# Patient Record
Sex: Female | Born: 1975 | Race: White | Hispanic: No | Marital: Single | Smoking: Current every day smoker
Health system: Southern US, Community
[De-identification: ages and names within clinical notes are randomized; demographics above are authoritative.]

## PROBLEM LIST (undated history)

## (undated) DIAGNOSIS — F329 Major depressive disorder, single episode, unspecified: Secondary | ICD-10-CM

## (undated) DIAGNOSIS — F32A Depression, unspecified: Secondary | ICD-10-CM

## (undated) DIAGNOSIS — G4700 Insomnia, unspecified: Secondary | ICD-10-CM

## (undated) HISTORY — PX: BREAST LUMPECTOMY: SHX2

---

## 2001-09-16 ENCOUNTER — Other Ambulatory Visit: Admission: RE | Admit: 2001-09-16 | Discharge: 2001-09-16 | Payer: Self-pay | Admitting: Gynecology

## 2002-12-14 ENCOUNTER — Other Ambulatory Visit: Admission: RE | Admit: 2002-12-14 | Discharge: 2002-12-14 | Payer: Self-pay | Admitting: Gynecology

## 2003-11-01 ENCOUNTER — Other Ambulatory Visit: Admission: RE | Admit: 2003-11-01 | Discharge: 2003-11-01 | Payer: Self-pay | Admitting: Gynecology

## 2003-12-27 ENCOUNTER — Encounter (INDEPENDENT_AMBULATORY_CARE_PROVIDER_SITE_OTHER): Payer: Self-pay | Admitting: Specialist

## 2003-12-27 ENCOUNTER — Ambulatory Visit (HOSPITAL_BASED_OUTPATIENT_CLINIC_OR_DEPARTMENT_OTHER): Admission: RE | Admit: 2003-12-27 | Discharge: 2003-12-27 | Payer: Self-pay | Admitting: Surgery

## 2003-12-27 ENCOUNTER — Ambulatory Visit (HOSPITAL_COMMUNITY): Admission: RE | Admit: 2003-12-27 | Discharge: 2003-12-27 | Payer: Self-pay | Admitting: Surgery

## 2004-10-30 ENCOUNTER — Other Ambulatory Visit: Admission: RE | Admit: 2004-10-30 | Discharge: 2004-10-30 | Payer: Self-pay | Admitting: Gynecology

## 2004-11-15 ENCOUNTER — Encounter: Admission: RE | Admit: 2004-11-15 | Discharge: 2004-11-15 | Payer: Self-pay | Admitting: Surgery

## 2004-11-18 ENCOUNTER — Ambulatory Visit (HOSPITAL_COMMUNITY): Admission: RE | Admit: 2004-11-18 | Discharge: 2004-11-18 | Payer: Self-pay | Admitting: Surgery

## 2004-12-19 ENCOUNTER — Ambulatory Visit (HOSPITAL_COMMUNITY): Admission: RE | Admit: 2004-12-19 | Discharge: 2004-12-19 | Payer: Self-pay | Admitting: Surgery

## 2004-12-19 ENCOUNTER — Encounter: Admission: RE | Admit: 2004-12-19 | Discharge: 2004-12-19 | Payer: Self-pay | Admitting: Surgery

## 2004-12-19 ENCOUNTER — Encounter (INDEPENDENT_AMBULATORY_CARE_PROVIDER_SITE_OTHER): Payer: Self-pay | Admitting: *Deleted

## 2004-12-19 ENCOUNTER — Ambulatory Visit (HOSPITAL_BASED_OUTPATIENT_CLINIC_OR_DEPARTMENT_OTHER): Admission: RE | Admit: 2004-12-19 | Discharge: 2004-12-19 | Payer: Self-pay | Admitting: Surgery

## 2005-11-06 ENCOUNTER — Other Ambulatory Visit: Admission: RE | Admit: 2005-11-06 | Discharge: 2005-11-06 | Payer: Self-pay | Admitting: Gynecology

## 2006-04-02 ENCOUNTER — Ambulatory Visit: Payer: Self-pay | Admitting: Internal Medicine

## 2006-11-02 ENCOUNTER — Other Ambulatory Visit: Admission: RE | Admit: 2006-11-02 | Discharge: 2006-11-02 | Payer: Self-pay | Admitting: Gynecology

## 2006-12-03 ENCOUNTER — Encounter: Admission: RE | Admit: 2006-12-03 | Discharge: 2006-12-03 | Payer: Self-pay | Admitting: Surgery

## 2007-05-03 ENCOUNTER — Ambulatory Visit: Payer: Self-pay | Admitting: Internal Medicine

## 2007-05-03 DIAGNOSIS — F329 Major depressive disorder, single episode, unspecified: Secondary | ICD-10-CM

## 2007-05-03 DIAGNOSIS — F3289 Other specified depressive episodes: Secondary | ICD-10-CM | POA: Insufficient documentation

## 2007-05-03 DIAGNOSIS — F519 Sleep disorder not due to a substance or known physiological condition, unspecified: Secondary | ICD-10-CM | POA: Insufficient documentation

## 2007-05-03 DIAGNOSIS — F411 Generalized anxiety disorder: Secondary | ICD-10-CM | POA: Insufficient documentation

## 2007-05-25 ENCOUNTER — Telehealth (INDEPENDENT_AMBULATORY_CARE_PROVIDER_SITE_OTHER): Payer: Self-pay | Admitting: *Deleted

## 2007-11-01 LAB — CONVERTED CEMR LAB: Pap Smear: NORMAL

## 2008-05-18 ENCOUNTER — Telehealth (INDEPENDENT_AMBULATORY_CARE_PROVIDER_SITE_OTHER): Payer: Self-pay | Admitting: *Deleted

## 2008-05-22 ENCOUNTER — Ambulatory Visit: Payer: Self-pay | Admitting: Internal Medicine

## 2008-05-22 DIAGNOSIS — R1084 Generalized abdominal pain: Secondary | ICD-10-CM | POA: Insufficient documentation

## 2008-05-22 DIAGNOSIS — K219 Gastro-esophageal reflux disease without esophagitis: Secondary | ICD-10-CM | POA: Insufficient documentation

## 2008-05-30 ENCOUNTER — Encounter: Admission: RE | Admit: 2008-05-30 | Discharge: 2008-05-30 | Payer: Self-pay | Admitting: Internal Medicine

## 2008-06-19 ENCOUNTER — Telehealth (INDEPENDENT_AMBULATORY_CARE_PROVIDER_SITE_OTHER): Payer: Self-pay | Admitting: *Deleted

## 2008-06-26 ENCOUNTER — Telehealth (INDEPENDENT_AMBULATORY_CARE_PROVIDER_SITE_OTHER): Payer: Self-pay | Admitting: *Deleted

## 2008-07-21 ENCOUNTER — Telehealth (INDEPENDENT_AMBULATORY_CARE_PROVIDER_SITE_OTHER): Payer: Self-pay | Admitting: *Deleted

## 2008-08-17 ENCOUNTER — Telehealth (INDEPENDENT_AMBULATORY_CARE_PROVIDER_SITE_OTHER): Payer: Self-pay | Admitting: *Deleted

## 2008-12-14 ENCOUNTER — Telehealth: Payer: Self-pay | Admitting: Internal Medicine

## 2009-01-15 ENCOUNTER — Telehealth: Payer: Self-pay | Admitting: Internal Medicine

## 2009-03-16 ENCOUNTER — Telehealth: Payer: Self-pay | Admitting: Internal Medicine

## 2010-03-06 ENCOUNTER — Emergency Department (HOSPITAL_BASED_OUTPATIENT_CLINIC_OR_DEPARTMENT_OTHER)
Admission: EM | Admit: 2010-03-06 | Discharge: 2010-03-06 | Disposition: A | Discharge status: Still In Hospital | Payer: Self-pay | Source: Home / Self Care | Admitting: Emergency Medicine

## 2010-03-07 ENCOUNTER — Inpatient Hospital Stay (HOSPITAL_COMMUNITY)
Admission: EM | Admit: 2010-03-07 | Discharge: 2010-03-10 | Payer: Self-pay | Attending: Internal Medicine | Admitting: Internal Medicine

## 2010-03-08 DIAGNOSIS — F411 Generalized anxiety disorder: Secondary | ICD-10-CM

## 2010-06-10 LAB — DIFFERENTIAL
Eosinophils Relative: 0 % (ref 0–5)
Lymphocytes Relative: 11 % — ABNORMAL LOW (ref 12–46)
Lymphs Abs: 1.7 10*3/uL (ref 0.7–4.0)
Monocytes Absolute: 0.5 10*3/uL (ref 0.1–1.0)
Monocytes Relative: 3 % (ref 3–12)

## 2010-06-10 LAB — RAPID URINE DRUG SCREEN, HOSP PERFORMED
Opiates: NOT DETECTED
Tetrahydrocannabinol: POSITIVE — AB

## 2010-06-10 LAB — HEMOGLOBIN A1C
Hgb A1c MFr Bld: 5.5 % (ref <5.7)
Mean Plasma Glucose: 111 mg/dL (ref <117)

## 2010-06-10 LAB — CBC
HCT: 41.6 % (ref 36.0–46.0)
MCV: 93.1 fL (ref 78.0–100.0)
RBC: 4.47 MIL/uL (ref 3.87–5.11)
WBC: 15.8 10*3/uL — ABNORMAL HIGH (ref 4.0–10.5)

## 2010-06-10 LAB — PREGNANCY, URINE: Preg Test, Ur: NEGATIVE

## 2010-06-10 LAB — BASIC METABOLIC PANEL
Chloride: 108 mEq/L (ref 96–112)
GFR calc Af Amer: >60 mL/min (ref >60)
Potassium: 4.3 mEq/L (ref 3.5–5.1)

## 2010-06-11 LAB — CBC
MCH: 32.6 pg (ref 26.0–34.0)
MCV: 92.1 fL (ref 78.0–100.0)
Platelets: 338 10*3/uL (ref 150–400)
RBC: 4.99 MIL/uL (ref 3.87–5.11)

## 2010-06-11 LAB — BASIC METABOLIC PANEL
BUN: 21 mg/dL (ref 6–23)
CO2: 19 mEq/L (ref 19–32)
Chloride: 108 mEq/L (ref 96–112)
Creatinine, Ser: 0.8 mg/dL (ref 0.4–1.2)
GFR calc Af Amer: >60 mL/min (ref >60)

## 2010-06-11 LAB — D-DIMER, QUANTITATIVE: D-Dimer, Quant: 3.99 ug/mL-FEU — ABNORMAL HIGH (ref 0.00–0.48)

## 2010-06-11 LAB — DIFFERENTIAL
Eosinophils Relative: 0 % (ref 0–5)
Lymphs Abs: 0.9 10*3/uL (ref 0.7–4.0)
Monocytes Absolute: 0.4 10*3/uL (ref 0.1–1.0)

## 2010-08-07 ENCOUNTER — Emergency Department (HOSPITAL_BASED_OUTPATIENT_CLINIC_OR_DEPARTMENT_OTHER)
Admission: EM | Admit: 2010-08-07 | Discharge: 2010-08-07 | Disposition: A | Discharge status: Home / Self Care | Payer: 59 | Attending: Emergency Medicine | Admitting: Emergency Medicine

## 2010-08-07 DIAGNOSIS — Z79899 Other long term (current) drug therapy: Secondary | ICD-10-CM | POA: Insufficient documentation

## 2010-08-07 DIAGNOSIS — R0602 Shortness of breath: Secondary | ICD-10-CM | POA: Insufficient documentation

## 2010-08-16 NOTE — Op Note (Signed)
NAMETORREY, HORSEMAN            ACCOUNT NO.:  [id number]   MEDICAL RECORD NO.:  [id number]          PATIENT TYPE:  AMB   LOCATION:  [facility]                         FACILITY:  [facility]   PHYSICIAN:  Currie Paris, M.D.DATE OF BIRTH:  02/24/1976   DATE OF PROCEDURE:  12/27/2003  DATE OF DISCHARGE:                                 OPERATIVE REPORT   [id number].   PREOPERATIVE DIAGNOSIS:  Right breast mass, probable fibroadenoma.   POSTOPERATIVE DIAGNOSIS:  Right breast mass, probable fibroadenoma.   OPERATION:  Excision right breast mass.   SURGEON:  Dr. Jamey Ripa   ANESTHESIA:  General (LMA).   CLINICAL HISTORY:  Heather Alexander is a 35 year old with a recently found right  breast mass that looks like a fibroadenoma.  After discussion of  alternatives, she decided to have this removed.   DESCRIPTION OF PROCEDURE:  The patient seen in the holding area and had no  further questions.  The right breast was marked as the operative side.   In the operating room, prior to being given any anesthesia or sedation, the  mass itself was identified and marked.  That was in the 12 o'clock position  of the right breast.   After satisfactory general anesthesia was obtained, the breast was prepped  and draped.  To help with postoperative analgesia, I injected Marcaine in  the skin and the surrounding tissues over the mass and around it and then  made an incision.  Subcutaneous tissue was divided with the cautery and the  top of the mass visualized and a holding suture of 3-0 Vicryl put through  it.  We were using this for traction, and the mass was excised with cautery.   The base was checked for hemostasis and once everything was dry, the breast  was re-closed with 3-0 Vicryl, skin with 4-0 Monocryl subcuticular, and  Dermabond.   The patient tolerated the procedure well.  There were no operative  complications.  All counts were correct.      CJS/MEDQ  D:  12/27/2003  T:  12/27/2003  Job:   [id number]   cc:   Gretta Cool, M.D.  [address] [place]  [place] [address]  Fax: [phone]

## 2010-08-16 NOTE — Op Note (Signed)
NAMESCOTTY, PINDER            ACCOUNT NO.:  [id number]   MEDICAL RECORD NO.:  [id number]          PATIENT TYPE:  AMB   LOCATION:  [facility]                          FACILITY:  [facility]   PHYSICIAN:  Currie Paris, M.D.DATE OF BIRTH:  [birth year]   DATE OF PROCEDURE:  12/19/2004  DATE OF DISCHARGE:                                 OPERATIVE REPORT   OFFICE MEDICAL RECORD NUMBER:  [id number]   PREOPERATIVE DIAGNOSIS:  Right breast mass.   POSTOPERATIVE DIAGNOSIS:  Right breast mass.   OPERATION:  Needle-guided excision of right breast mass.   SURGEON:  Currie Paris, M.D.   ANESTHESIA:  General.   CLINICAL HISTORY:  This patient has had a prior right breast mass removed  from about the 11 o'clock position.  Initially, it was thought to be a  fibroadenoma, but by pathology, this had more characteristics of a phyllodes  tumor.   A year later the patient was able to feel an area of abnormality just at the  medial aspect of the prior biopsy site.  I could not appreciate a discrete  mass, but there was some increased nodularity there.  An ultrasound showed  adjacent nodule.  An MRI showed nothing to suggest cancer and the nodule was  not seen on MRI.  After discussion with the patient, we elected to do a  needle-guided excisional biopsy and I elected to the guidewire because I was  not convinced that the palpable area that I could feel and the patient could  feel was the same as the abnormality seen on ultrasound, since this was  about a 1-cm abnormality.   DESCRIPTION OF PROCEDURE:  The patient was seen in the holding area and she  had no further questions.  She already had her guidewire in place and we  reviewed the films and confirmed that this was the right site of the breast  to be operated on.   She was taken to the operating room and after satisfactory general  anesthesia had been obtained, the breast was prepped and draped and a time-  out occurred.   The old  scar, as noted, was curvilinear in the upper-outer quadrant of the  right breast.  There was an X marking the nodule and this nodule was about 2  cm medial to the medial end of the prior scar and about 1.5 to 2 cm below  the line of the prior scar.  The guidewire entered fairly far medial and  tracked directly laterally through the area that was marked on the skin.   I had initially thought that I would be able to extend the old incision,  since the palpable abnormality was directly in line with that and medial to  it, but I elected instead, to be sure we got this area out without any  problems, to make incision directly over the marked area.  I therefore made  a curvilinear incision, divided a little of the subcutaneous tissue until I  could see breast tissue, which was very dense and fibrous.  Then using some  retractors, I was able to elevate the  skin medially until I could find the  guidewire and manipulate it into the wound.   At this point, I then used some Allis clamps to grasp the tissue around the  guidewire and did an excisional biopsy of the tissue, taking a long cylinder  of tissue around the guidewire, not quite down to chest wall so there was  still little bit of tissue there.  Medially, the tissue was very irregular,  suggestive of some scar tissue from her prior biopsy.  I could feel a  nodular density within the specimen, so I felt that we had this area out,  but what I was feeling in the specimen I think too small to have been noted  by physical.   Once this was off, I went ahead and took some of the dense tissue that was  at the the lateral superior edge of my biopsy site, which was now the medial  inferior aspect of the prior scar, to be sure first that I was well around  the nodule and second that this area of palpable abnormality was also  excised and sampled.   Once this was done, I made sure everything was dry.  I injected 0.25% plain  Marcaine to help with  postop analgesia.  I closed in layers with 3-0 Vicryl,  4-0 Monocryl subcuticular and Dermabond.   The patient tolerated the procedure well.  There were no complications and  all counts were correct.      Currie Paris, M.D.  Electronically Signed     CJS/MEDQ  D:  12/19/2004  T:  12/20/2004  Job:  [id number]   cc:   Gretta Cool, M.D.  Fax: [phone]

## 2011-08-06 ENCOUNTER — Encounter (HOSPITAL_BASED_OUTPATIENT_CLINIC_OR_DEPARTMENT_OTHER): Payer: Self-pay

## 2011-08-06 ENCOUNTER — Emergency Department (HOSPITAL_BASED_OUTPATIENT_CLINIC_OR_DEPARTMENT_OTHER)
Admission: EM | Admit: 2011-08-06 | Discharge: 2011-08-06 | Disposition: A | Discharge status: Home / Self Care | Payer: 59 | Attending: Emergency Medicine | Admitting: Emergency Medicine

## 2011-08-06 ENCOUNTER — Emergency Department (INDEPENDENT_AMBULATORY_CARE_PROVIDER_SITE_OTHER): Payer: 59

## 2011-08-06 DIAGNOSIS — IMO0001 Reserved for inherently not codable concepts without codable children: Secondary | ICD-10-CM | POA: Insufficient documentation

## 2011-08-06 DIAGNOSIS — R52 Pain, unspecified: Secondary | ICD-10-CM

## 2011-08-06 DIAGNOSIS — R509 Fever, unspecified: Secondary | ICD-10-CM | POA: Insufficient documentation

## 2011-08-06 DIAGNOSIS — R0602 Shortness of breath: Secondary | ICD-10-CM | POA: Insufficient documentation

## 2011-08-06 DIAGNOSIS — Z79899 Other long term (current) drug therapy: Secondary | ICD-10-CM | POA: Insufficient documentation

## 2011-08-06 DIAGNOSIS — J209 Acute bronchitis, unspecified: Secondary | ICD-10-CM | POA: Insufficient documentation

## 2011-08-06 DIAGNOSIS — R079 Chest pain, unspecified: Secondary | ICD-10-CM

## 2011-08-06 DIAGNOSIS — R05 Cough: Secondary | ICD-10-CM | POA: Insufficient documentation

## 2011-08-06 DIAGNOSIS — R070 Pain in throat: Secondary | ICD-10-CM | POA: Insufficient documentation

## 2011-08-06 DIAGNOSIS — R059 Cough, unspecified: Secondary | ICD-10-CM | POA: Insufficient documentation

## 2011-08-06 MED ORDER — ACETAMINOPHEN 325 MG PO TABS
650.0000 mg | ORAL_TABLET | Freq: Once | ORAL | Status: AC
  Administered 2011-08-06: 650 mg via ORAL
  Filled 2011-08-06: qty 2

## 2011-08-06 MED ORDER — ALBUTEROL SULFATE HFA 108 (90 BASE) MCG/ACT IN AERS
2.0000 | INHALATION_SPRAY | Freq: Once | RESPIRATORY_TRACT | Status: AC
  Administered 2011-08-06: 2 via RESPIRATORY_TRACT
  Filled 2011-08-06: qty 6.7

## 2011-08-06 MED ORDER — HYDROCOD POLST-CHLORPHEN POLST 10-8 MG/5ML PO LQCR: 5.0000 mL | Freq: Two times a day (BID) | ORAL | Status: AC | PRN

## 2011-08-06 MED ORDER — AZITHROMYCIN 250 MG PO TABS: 250.0000 mg | ORAL_TABLET | Freq: Every day | ORAL | Status: AC

## 2011-08-06 MED ORDER — ALBUTEROL SULFATE (5 MG/ML) 0.5% IN NEBU
5.0000 mg | INHALATION_SOLUTION | Freq: Once | RESPIRATORY_TRACT | Status: AC
  Administered 2011-08-06: 5 mg via RESPIRATORY_TRACT
  Filled 2011-08-06: qty 1

## 2011-08-06 NOTE — ED Notes (Signed)
Pt requesting a prescription for hycodan cough syrup. Will notify EDP.

## 2011-08-06 NOTE — Discharge Instructions (Signed)

## 2011-08-06 NOTE — ED Provider Notes (Signed)
History     CSN: [id number]  Arrival date & time 08/06/11  2100   First MD Initiated Contact with Patient 08/06/11 2126      Chief Complaint  Patient presents with  . Cough    (Consider location/radiation/quality/duration/timing/severity/associated sxs/prior treatment) Patient is a 36 y.o. female presenting with cough. The history is provided by the patient.  Cough This is a new problem. Episode onset: 3 days ago. The problem occurs constantly. The problem has been gradually worsening. The cough is productive of sputum. The maximum temperature recorded prior to her arrival was 101 to 101.9 F. Associated symptoms include chills, sore throat, myalgias and shortness of breath. She has tried nothing for the symptoms. The treatment provided no relief. She is a smoker. Her past medical history is significant for pneumonia.    History reviewed. No pertinent past medical history.  Past Surgical History  Procedure Date  . Breast lumpectomy     No family history on file.  History  Substance Use Topics  . Smoking status: Current Everyday Smoker  . Smokeless tobacco: Not on file  . Alcohol Use: No    OB History    Grav Para Term Preterm Abortions TAB SAB Ect Mult Living                  Review of Systems  Constitutional: Positive for chills.  HENT: Positive for sore throat.   Respiratory: Positive for cough and shortness of breath.   Musculoskeletal: Positive for myalgias.  All other systems reviewed and are negative.    Allergies  Review of patient's allergies indicates no known allergies.  Home Medications   Current Outpatient Rx  Name Route Sig Dispense Refill  . ALBUTEROL SULFATE HFA 108 (90 BASE) MCG/ACT IN AERS Inhalation Inhale 3 puffs into the lungs every 6 (six) hours as needed. Patient used this medication for shortness of breath.    . ALPRAZOLAM 2 MG PO TABS Oral Take 2 mg by mouth at bedtime as needed.    Marland Kitchen ZYRTEC PO Oral Take 1 tablet by mouth daily as  needed. Patient uses this medication for her allergies.    Marland Kitchen ESCITALOPRAM OXALATE 20 MG PO TABS Oral Take 20 mg by mouth daily.    Marland Kitchen ONE-DAILY MULTI VITAMINS PO TABS Oral Take 1 tablet by mouth daily.    Marland Kitchen MUCINEX FAST-MAX CONGEST COLD PO Oral Take 20 mLs by mouth daily as needed. Patient used this medication for her cold symptoms.    Marland Kitchen ROPINIROLE HCL 5 MG PO TABS Oral Take 5 mg by mouth at bedtime. Patient uses 10 milligrams of this medication.    . TRAZODONE HCL 50 MG PO TABS Oral Take 50 mg by mouth at bedtime.      BP 142/83  Pulse 87  Temp(Src) 101.9 F (38.8 C) (Oral)  Resp 20  Ht 5\' 4"  (1.626 m)  Wt 235 lb (106.595 kg)  BMI 40.34 kg/m2  SpO2 98%  LMP 07/27/2011  Physical Exam  Nursing note and vitals reviewed. Constitutional: She is oriented to person, place, and time. She appears well-developed and well-nourished. No distress.  HENT:  Head: Normocephalic and atraumatic.  Neck: Normal range of motion. Neck supple.  Cardiovascular: Normal rate and regular rhythm.  Exam reveals no gallop and no friction rub.   No murmur heard. Pulmonary/Chest: Effort normal and breath sounds normal. No respiratory distress. She has no wheezes.  Abdominal: Soft. Bowel sounds are normal. She exhibits no distension. There is no  tenderness.  Musculoskeletal: Normal range of motion.  Neurological: She is alert and oriented to person, place, and time.  Skin: Skin is warm and dry. She is not diaphoretic.    ED Course  Procedures (including critical care time)  Labs Reviewed - No data to display No results found.   No diagnosis found.    MDM  The chest xray looks okay.  Will treat with zmax, mdi as patient has history of pulmonary issues.  She is to return should she worsen.        Geoffery Lyons, MD 08/06/11 2238

## 2011-08-06 NOTE — ED Notes (Signed)
nonprod cough x 3 days

## 2012-02-29 IMAGING — CT CT ANGIO CHEST
2 of 6 series · 19 of 36 positions shown · IV contrast (APPLIED)
Comparison: None

CLINICAL DATA: Shortness of breath and chest pain.

CT ANGIOGRAPHY CHEST WITH CONTRAST
TECHNIQUE: Multidetector CT imaging of the chest was performed
using the standard protocol during bolus administration of
intravenous contrast.  Multiplanar CT image reconstructions
including MIPs were obtained to evaluate the vascular anatomy.
Contrast:  80 ml [id number]

[Series 5: pe 1.0 b25f · axial · 0.62mm/px · z∈[+1336,+1526]mm · 18 of 212 slices shown]
[im 11/212  lung]
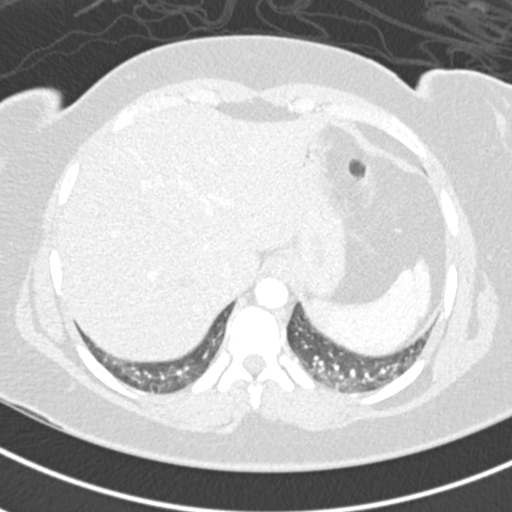
[im 22/212  mediastinal]
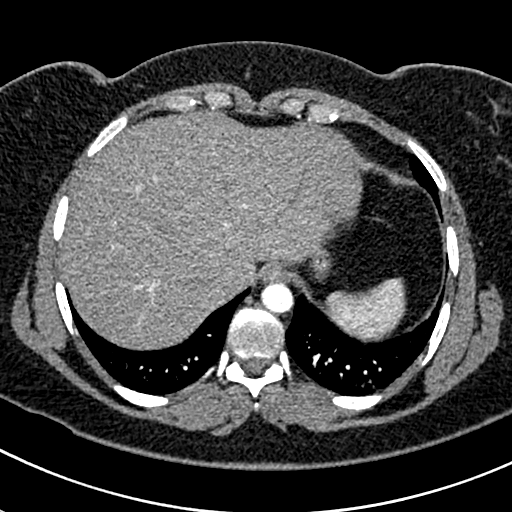
[im 32/212  lung]
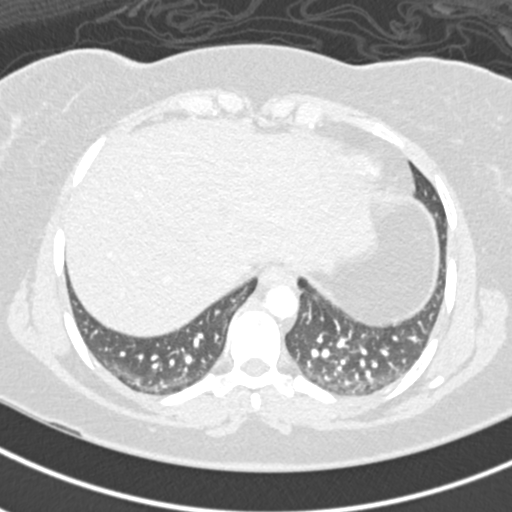
[im 43/212  mediastinal]
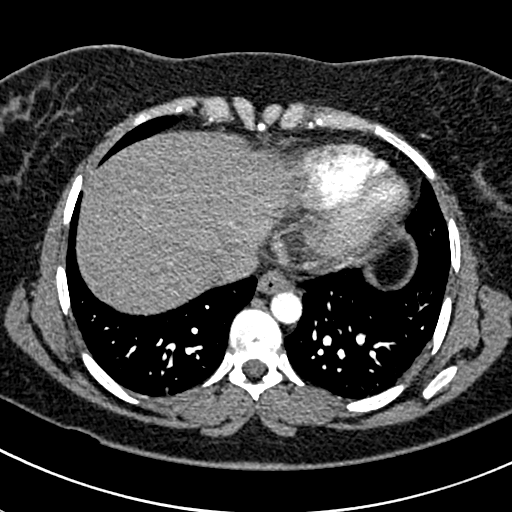
[im 53/212  lung]
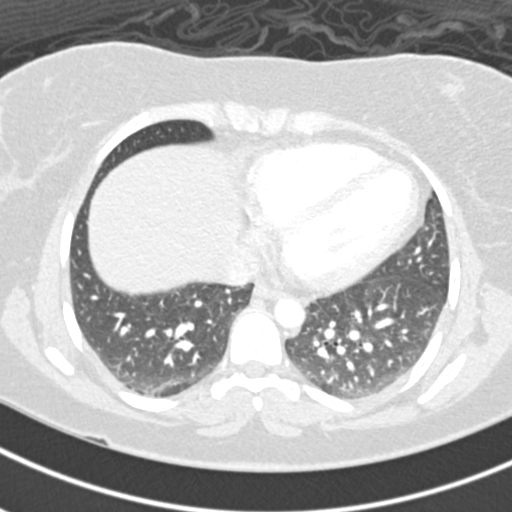
[im 64/212  mediastinal]
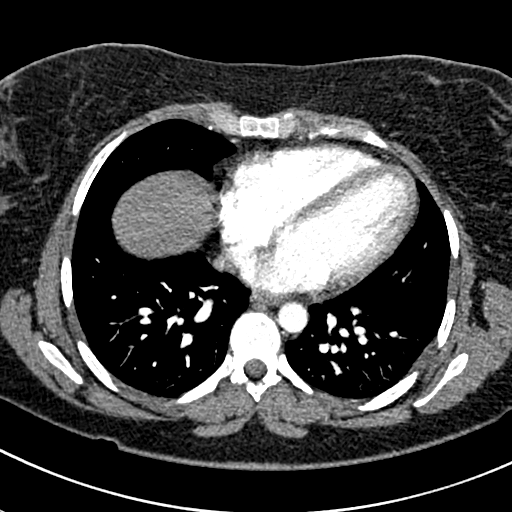
[im 74/212  lung]
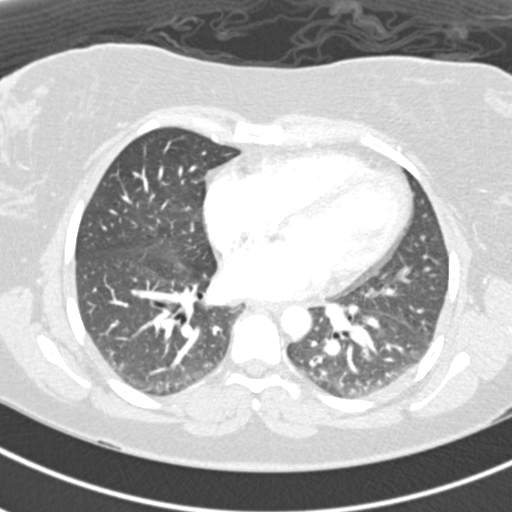
[im 85/212  mediastinal]
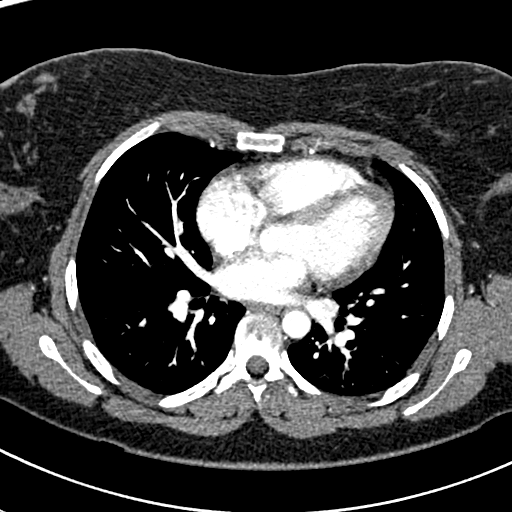
[im 95/212  lung]
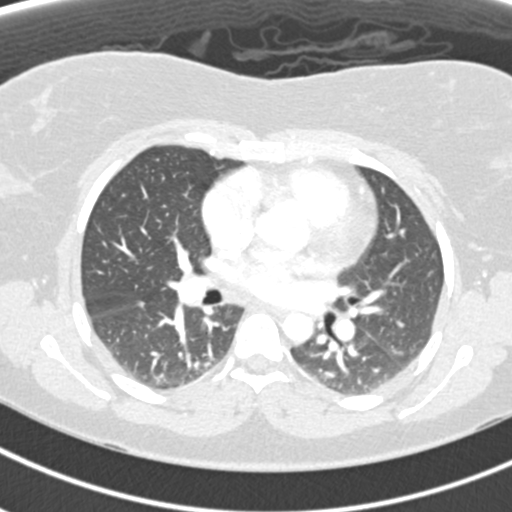
[im 117/212  mediastinal]
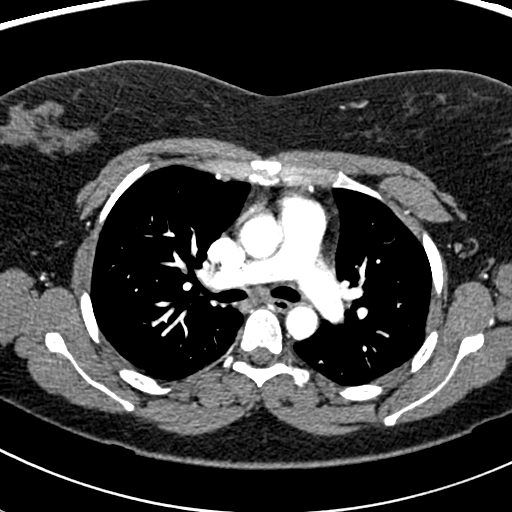
[im 127/212  lung]
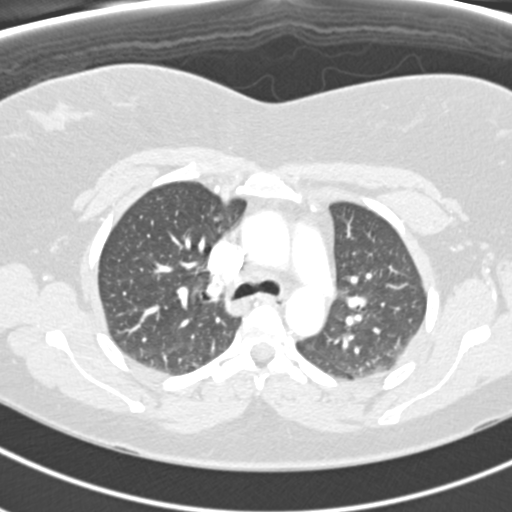
[im 138/212  mediastinal]
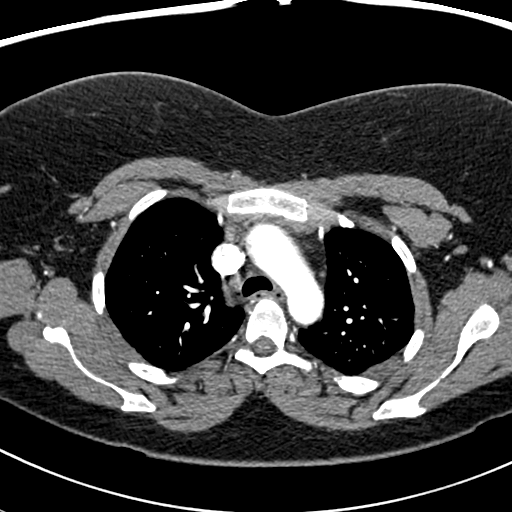
[im 148/212  lung]
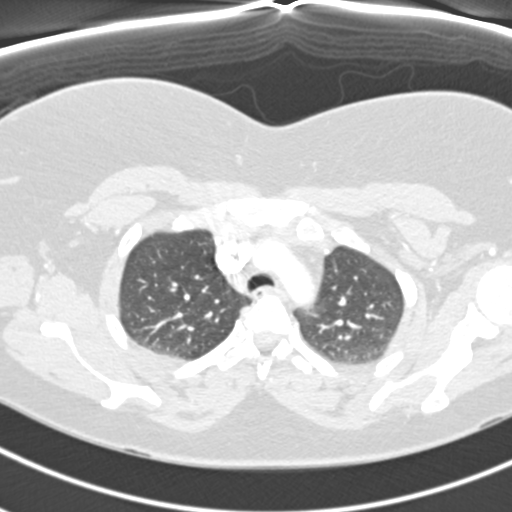
[im 159/212  mediastinal]
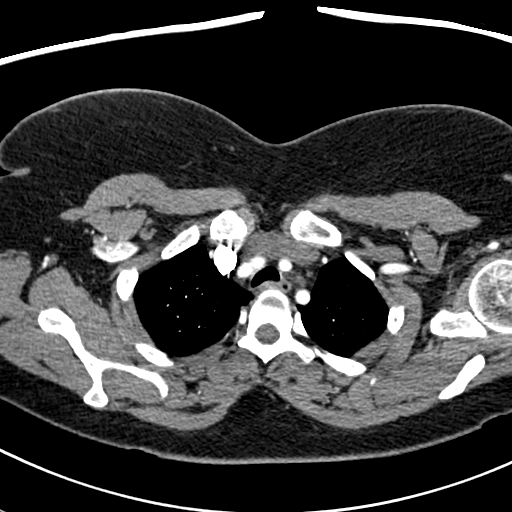
[im 169/212  lung]
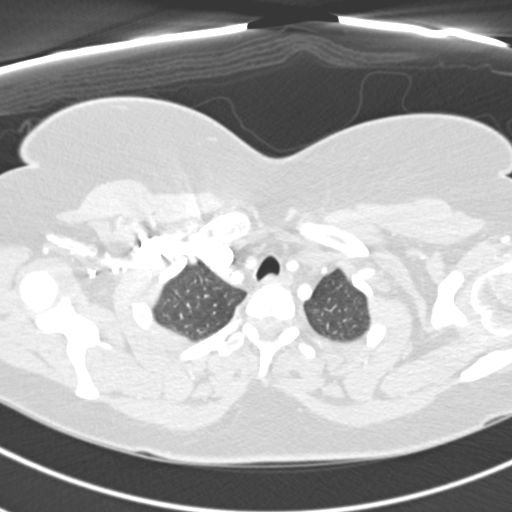
[im 180/212  mediastinal]
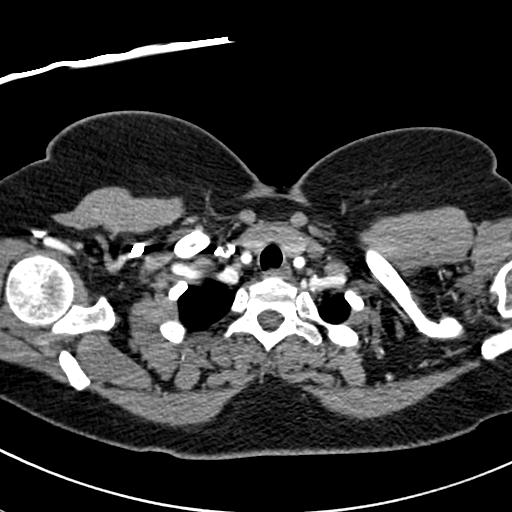
[im 190/212  lung]
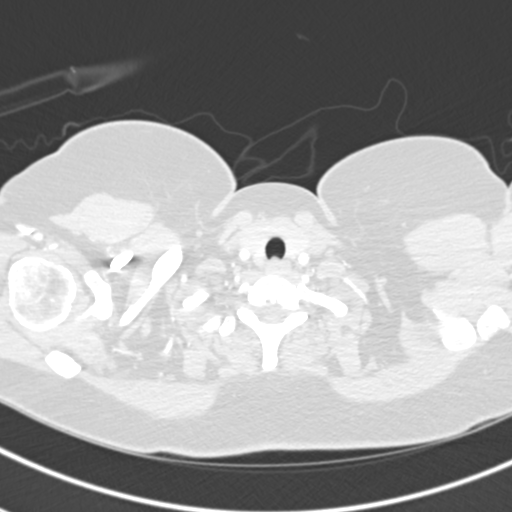
[im 201/212  mediastinal]
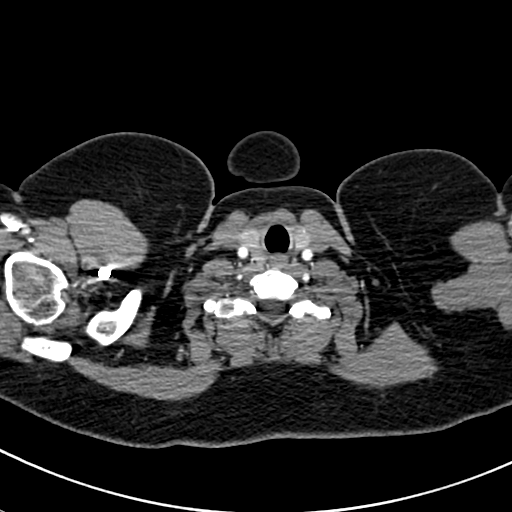

[Series 8: pe 2.0 coronal · coronal · 0.44mm/px · 1 of 123 slices shown]
[im 62/123  mediastinal]
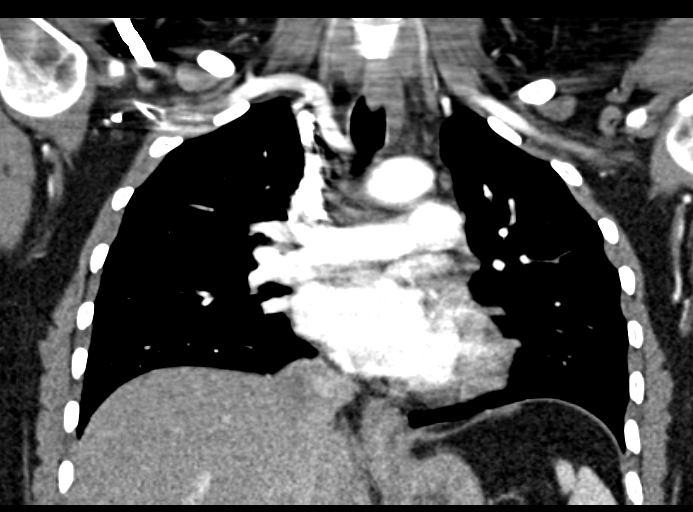

[19 of 36 positions shown; findings below may reference images not displayed]

FINDINGS: The chest wall is unremarkable.  The bony thorax is
intact.

The heart is normal in size.  No pericardial effusion.  No
mediastinal or hilar adenopathy.  The aorta is normal in caliber.
No dissection.  The esophagus is grossly normal.

The pulmonary arterial tree is well opacified.  No filling defects
to suggest pulmonary emboli.

Examination of the lung parenchyma demonstrates no acute pulmonary
findings.  Minimal dependent atelectasis.  No pleural effusions.
No pulmonary nodules.

The upper abdomen is unremarkable.  There is diffuse fatty
infiltration of the liver noted.

Review of the MIP images confirms the above findings.
IMPRESSION: 1.  No CT findings for pulmonary embolism.
2.  Normal thoracic aorta.
3.  No acute pulmonary findings.

## 2013-01-22 ENCOUNTER — Emergency Department (HOSPITAL_BASED_OUTPATIENT_CLINIC_OR_DEPARTMENT_OTHER)
Admission: EM | Admit: 2013-01-22 | Discharge: 2013-01-22 | Disposition: A | Discharge status: Home / Self Care | Payer: 59 | Attending: Emergency Medicine | Admitting: Emergency Medicine

## 2013-01-22 ENCOUNTER — Encounter (HOSPITAL_BASED_OUTPATIENT_CLINIC_OR_DEPARTMENT_OTHER): Payer: Self-pay | Admitting: Emergency Medicine

## 2013-01-22 DIAGNOSIS — Z79899 Other long term (current) drug therapy: Secondary | ICD-10-CM | POA: Insufficient documentation

## 2013-01-22 DIAGNOSIS — F17200 Nicotine dependence, unspecified, uncomplicated: Secondary | ICD-10-CM | POA: Insufficient documentation

## 2013-01-22 DIAGNOSIS — R21 Rash and other nonspecific skin eruption: Secondary | ICD-10-CM

## 2013-01-22 DIAGNOSIS — R220 Localized swelling, mass and lump, head: Secondary | ICD-10-CM | POA: Insufficient documentation

## 2013-01-22 MED ORDER — DIPHENHYDRAMINE HCL 25 MG PO TABS: 25.0000 mg | ORAL_TABLET | Freq: Four times a day (QID) | ORAL | Status: AC

## 2013-01-22 MED ORDER — PREDNISONE (PAK) 10 MG PO TABS: 10.0000 mg | ORAL_TABLET | Freq: Every day | ORAL | Status: AC

## 2013-01-22 MED ORDER — FAMOTIDINE 20 MG PO TABS
20.0000 mg | ORAL_TABLET | Freq: Once | ORAL | Status: AC
  Administered 2013-01-22: 20 mg via ORAL
  Filled 2013-01-22: qty 1

## 2013-01-22 MED ORDER — PREDNISONE 50 MG PO TABS
60.0000 mg | ORAL_TABLET | Freq: Once | ORAL | Status: AC
  Administered 2013-01-22: 60 mg via ORAL
  Filled 2013-01-22 (×2): qty 1

## 2013-01-22 MED ORDER — FAMOTIDINE 20 MG PO TABS: 20.0000 mg | ORAL_TABLET | Freq: Two times a day (BID) | ORAL | Status: AC

## 2013-01-22 MED ORDER — DIPHENHYDRAMINE HCL 25 MG PO CAPS
50.0000 mg | ORAL_CAPSULE | Freq: Once | ORAL | Status: AC
  Administered 2013-01-22: 50 mg via ORAL
  Filled 2013-01-22: qty 2

## 2013-01-22 NOTE — ED Provider Notes (Signed)
CSN: [id number]     Arrival date & time 01/22/13  2047 History   First MD Initiated Contact with Patient 01/22/13 2218     Chief Complaint  Patient presents with  . Urticaria   (Consider location/radiation/quality/duration/timing/severity/associated sxs/prior Treatment) HPI Patient presents with pruritic rash that began yesterday, starting on bilateral arms, and spreading to her anterior chest, face, and bilateral feet.  She also has had some lip swelling and itching but denies any itching or swelling within the mouth or throat.  Denies any difficulty swallowing or breathing.  She took a zyrtec last night with temporary relief.  Pt has had problems with hives and allergic reactions years ago and has been to allergists, has many environmental allergies.  Pt denies any change in personal care products, soaps, shampoos, new clothes, foods, any exposures she can think of.   History reviewed. No pertinent past medical history. Past Surgical History  Procedure Laterality Date  . Breast lumpectomy     History reviewed. No pertinent family history. History  Substance Use Topics  . Smoking status: Current Every Day Smoker  . Smokeless tobacco: Not on file  . Alcohol Use: No   OB History   Grav Para Term Preterm Abortions TAB SAB Ect Mult Living                 Review of Systems  HENT: Negative for sore throat and trouble swallowing.   Respiratory: Negative for cough, choking and shortness of breath.   Skin: Positive for rash.    Allergies  Review of patient's allergies indicates no known allergies.  Home Medications   Current Outpatient Rx  Name  Route  Sig  Dispense  Refill  . venlafaxine (EFFEXOR) 100 MG tablet   Oral   Take 150 mg by mouth 1 day or 1 dose.         . albuterol (PROVENTIL HFA;VENTOLIN HFA) 108 (90 BASE) MCG/ACT inhaler   Inhalation   Inhale 3 puffs into the lungs every 6 (six) hours as needed. Patient used this medication for shortness of breath.          . alprazolam (XANAX) 2 MG tablet   Oral   Take 2 mg by mouth at bedtime as needed.         . Cetirizine HCl (ZYRTEC PO)   Oral   Take 1 tablet by mouth daily as needed. Patient uses this medication for her allergies.         . chlorpheniramine-HYDROcodone (TUSSIONEX PENNKINETIC ER) 10-8 MG/5ML LQCR   Oral   Take 5 mLs by mouth every 12 (twelve) hours as needed.   75 mL   0   . diphenhydrAMINE (BENADRYL) 25 MG tablet   Oral   Take 1 tablet (25 mg total) by mouth every 6 (six) hours. X 3 days, then as needed   20 tablet   0   . escitalopram (LEXAPRO) 20 MG tablet   Oral   Take 20 mg by mouth daily.         . famotidine (PEPCID) 20 MG tablet   Oral   Take 1 tablet (20 mg total) by mouth 2 (two) times daily. X 3 days then as needed   30 tablet   0   . Multiple Vitamin (MULTIVITAMIN) tablet   Oral   Take 1 tablet by mouth daily.         Marland Kitchen Phenylephrine-DM-GG-APAP (MUCINEX FAST-MAX CONGEST COLD PO)   Oral   Take 20 mLs  by mouth daily as needed. Patient used this medication for her cold symptoms.         . predniSONE (STERAPRED UNI-PAK) 10 MG tablet   Oral   Take 1 tablet (10 mg total) by mouth daily. Day 1: take 6 tabs.  Day 2: 5 tabs  Day 3: 4 tabs  Day 4: 3 tabs  Day 5: 2 tabs  Day 6: 1 tab   21 tablet   0   . ropinirole (REQUIP) 5 MG tablet   Oral   Take 5 mg by mouth at bedtime. Patient uses 10 milligrams of this medication.         . traZODone (DESYREL) 50 MG tablet   Oral   Take 100 mg by mouth at bedtime.           BP 139/96  Pulse 96  Temp(Src) 99.5 F (37.5 C) (Oral)  Resp 20  Ht 5\' 5"  (1.651 m)  Wt 231 lb (104.781 kg)  BMI 38.44 kg/m2  SpO2 98%  LMP 01/14/2013 Physical Exam  Nursing note and vitals reviewed. Constitutional: She appears well-developed and well-nourished. No distress.  HENT:  Head: Normocephalic and atraumatic.  Mouth/Throat: Oropharynx is clear and moist. No oropharyngeal exudate.  Neck: Neck supple.   Cardiovascular: Normal rate and regular rhythm.   Pulmonary/Chest: Effort normal and breath sounds normal. No stridor. No respiratory distress. She has no wheezes. She has no rales.  Neurological: She is alert.  Skin: Rash noted. She is not diaphoretic.  Erythematous rash - maculopapular over arms and feet, urticarial over her back.  Nontender.      ED Course  Procedures (including critical care time) Labs Review Labs Reviewed - No data to display Imaging Review No results found.  EKG Interpretation   None       MDM   1. Rash    Pt with pruritic rash that began yesterday.  No airway concerns.  No itching or swelling of the mouth of throat, no difficulty breathing or swallowing.  Likely mild/moderate allergic reaction. PO prednisone, benadryl, pepcid given in ED - d/c home with same.  Unclear etiology.  Allergist follow up. Discussed  findings, treatment, and follow up  with patient.  Pt given return precautions.  Pt verbalizes understanding and agrees with plan.        Trixie Dredge, PA-C 01/22/13 2313

## 2013-01-22 NOTE — ED Notes (Signed)
rx x 3 given for prednisone pepcid and benadryl- d/c home with ride

## 2013-01-22 NOTE — ED Notes (Signed)
Pt reports rash with hives onset 1 day ago that is on face chest and arms and having mild facial swelling , today rash has spread to legs and feet Zyrtec gave minimal relief

## 2013-01-23 NOTE — ED Provider Notes (Signed)
Medical screening examination/treatment/procedure(s) were performed by non-physician practitioner and as supervising physician I was immediately available for consultation/collaboration.  EKG Interpretation   None         Gavin Pound. Dominyk Law, MD 01/23/13 [phone]

## 2013-02-12 ENCOUNTER — Encounter (HOSPITAL_BASED_OUTPATIENT_CLINIC_OR_DEPARTMENT_OTHER): Payer: Self-pay | Admitting: Emergency Medicine

## 2013-02-12 ENCOUNTER — Emergency Department (HOSPITAL_BASED_OUTPATIENT_CLINIC_OR_DEPARTMENT_OTHER)
Admission: EM | Admit: 2013-02-12 | Discharge: 2013-02-12 | Disposition: A | Discharge status: Home / Self Care | Payer: 59 | Attending: Emergency Medicine | Admitting: Emergency Medicine

## 2013-02-12 DIAGNOSIS — J40 Bronchitis, not specified as acute or chronic: Secondary | ICD-10-CM | POA: Insufficient documentation

## 2013-02-12 DIAGNOSIS — F17200 Nicotine dependence, unspecified, uncomplicated: Secondary | ICD-10-CM | POA: Insufficient documentation

## 2013-02-12 DIAGNOSIS — J040 Acute laryngitis: Secondary | ICD-10-CM | POA: Insufficient documentation

## 2013-02-12 DIAGNOSIS — Z79899 Other long term (current) drug therapy: Secondary | ICD-10-CM | POA: Insufficient documentation

## 2013-02-12 MED ORDER — IPRATROPIUM BROMIDE 0.02 % IN SOLN: 0.2500 mg | Freq: Four times a day (QID) | RESPIRATORY_TRACT | Status: AC | PRN

## 2013-02-12 MED ORDER — ALBUTEROL SULFATE (5 MG/ML) 0.5% IN NEBU
5.0000 mg | INHALATION_SOLUTION | Freq: Once | RESPIRATORY_TRACT | Status: AC
  Administered 2013-02-12: 5 mg via RESPIRATORY_TRACT
  Filled 2013-02-12: qty 1

## 2013-02-12 MED ORDER — HYDROCODONE-ACETAMINOPHEN 7.5-325 MG/15ML PO SOLN: 10.0000 mL | Freq: Four times a day (QID) | ORAL | Status: AC | PRN

## 2013-02-12 MED ORDER — IPRATROPIUM BROMIDE 0.02 % IN SOLN
0.5000 mg | Freq: Once | RESPIRATORY_TRACT | Status: AC
  Administered 2013-02-12: 0.5 mg via RESPIRATORY_TRACT
  Filled 2013-02-12: qty 2.5

## 2013-02-12 NOTE — ED Notes (Signed)
Cough times 2 weeks seen at [facility] for same given Z-pack and prednisone  Cough is now dry and hacking OTC musinex  Not effective for cough

## 2013-02-12 NOTE — ED Provider Notes (Signed)
CSN: [id number]     Arrival date & time 02/12/13  2124 History   First MD Initiated Contact with Patient 02/12/13 2141     Chief Complaint  Patient presents with  . Cough    Hx bronchitis   (Consider location/radiation/quality/duration/timing/severity/associated sxs/prior Treatment) Patient is a 37 y.o. female presenting with cough. The history is provided by the patient. No language interpreter was used.  Cough Cough characteristics:  Productive, non-productive and hacking Severity:  Moderate Associated symptoms: no chills, no fever and no shortness of breath   Associated symptoms comment:  Cough, nasal congestion, sinus pressure for the past 2 weeks without fever, now with loss of voice. She is using a steroid taper, Z-Pack and albuterol nebulizers without relief. She is a smoker without cigarettes in 2 days. No N, V.   History reviewed. No pertinent past medical history. Past Surgical History  Procedure Laterality Date  . Breast lumpectomy     History reviewed. No pertinent family history. History  Substance Use Topics  . Smoking status: Current Every Day Smoker  . Smokeless tobacco: Not on file  . Alcohol Use: No   OB History   Grav Para Term Preterm Abortions TAB SAB Ect Mult Living                 Review of Systems  Constitutional: Negative for fever and chills.  HENT: Positive for congestion, sinus pressure and voice change.   Respiratory: Positive for cough. Negative for shortness of breath.   Cardiovascular: Negative.   Gastrointestinal: Negative.  Negative for nausea, vomiting and abdominal pain.  Musculoskeletal: Negative.   Skin: Negative.   Neurological: Negative.     Allergies  Review of patient's allergies indicates no known allergies.  Home Medications   Current Outpatient Rx  Name  Route  Sig  Dispense  Refill  . albuterol (PROVENTIL HFA;VENTOLIN HFA) 108 (90 BASE) MCG/ACT inhaler   Inhalation   Inhale 3 puffs into the lungs every 6 (six) hours  as needed. Patient used this medication for shortness of breath.         . alprazolam (XANAX) 2 MG tablet   Oral   Take 2 mg by mouth at bedtime as needed.         . Cetirizine HCl (ZYRTEC PO)   Oral   Take 1 tablet by mouth daily as needed. Patient uses this medication for her allergies.         . chlorpheniramine-HYDROcodone (TUSSIONEX PENNKINETIC ER) 10-8 MG/5ML LQCR   Oral   Take 5 mLs by mouth every 12 (twelve) hours as needed.   75 mL   0   . diphenhydrAMINE (BENADRYL) 25 MG tablet   Oral   Take 1 tablet (25 mg total) by mouth every 6 (six) hours. X 3 days, then as needed   20 tablet   0   . escitalopram (LEXAPRO) 20 MG tablet   Oral   Take 20 mg by mouth daily.         . famotidine (PEPCID) 20 MG tablet   Oral   Take 1 tablet (20 mg total) by mouth 2 (two) times daily. X 3 days then as needed   30 tablet   0   . Multiple Vitamin (MULTIVITAMIN) tablet   Oral   Take 1 tablet by mouth daily.         Marland Kitchen Phenylephrine-DM-GG-APAP (MUCINEX FAST-MAX CONGEST COLD PO)   Oral   Take 20 mLs by mouth daily as needed.  Patient used this medication for her cold symptoms.         . predniSONE (STERAPRED UNI-PAK) 10 MG tablet   Oral   Take 1 tablet (10 mg total) by mouth daily. Day 1: take 6 tabs.  Day 2: 5 tabs  Day 3: 4 tabs  Day 4: 3 tabs  Day 5: 2 tabs  Day 6: 1 tab   21 tablet   0   . ropinirole (REQUIP) 5 MG tablet   Oral   Take 5 mg by mouth at bedtime. Patient uses 10 milligrams of this medication.         . traZODone (DESYREL) 50 MG tablet   Oral   Take 100 mg by mouth at bedtime.          Marland Kitchen venlafaxine (EFFEXOR) 100 MG tablet   Oral   Take 150 mg by mouth 1 day or 1 dose.          BP 157/89  Pulse 94  Temp(Src) 97.8 F (36.6 C) (Oral)  Resp 20  Ht 5\' 5"  (1.651 m)  Wt 230 lb (104.327 kg)  BMI 38.27 kg/m2  SpO2 99%  LMP 01/14/2013 Physical Exam  Constitutional: She is oriented to person, place, and time. She appears well-developed  and well-nourished.  HENT:  Head: Normocephalic.  Right Ear: External ear normal.  Left Ear: External ear normal.  Nose: Mucosal edema present. Right sinus exhibits frontal sinus tenderness. Left sinus exhibits frontal sinus tenderness.  Mouth/Throat: Oropharynx is clear and moist.  Neck: Normal range of motion. Neck supple.  Cardiovascular: Normal rate and normal heart sounds.   No murmur heard. Pulmonary/Chest: Effort normal. She has no wheezes. She has no rales.  Actively coughing during respiration.  Abdominal: Soft. Bowel sounds are normal. She exhibits no distension. There is no tenderness.  Musculoskeletal: Normal range of motion.  Lymphadenopathy:    She has no cervical adenopathy.  Neurological: She is alert and oriented to person, place, and time.  Skin: Skin is warm and dry. No pallor.  Psychiatric: She has a normal mood and affect.    ED Course  Procedures (including critical care time) Labs Review Labs Reviewed - No data to display Imaging Review No results found.  EKG Interpretation   None       MDM  No diagnosis found. 1. Bronchitis 2. Laryngitis  Cough improved with duoneb. Discussed supportive measures and symptomatic treatment. Stable for discharge.     Arnoldo Hooker, PA-C 02/12/13 2256

## 2013-02-15 NOTE — ED Provider Notes (Signed)
History/physical exam/procedure(s) were performed by non-physician practitioner and as supervising physician I was immediately available for consultation/collaboration. I have reviewed all notes and am in agreement with care and plan.   Heather S Dareen Gutzwiller, MD 02/15/13 1412 

## 2013-07-31 IMAGING — CR DG CHEST 2V
2 series · 2 of 2 positions shown · non-contrast
Comparison: 03/06/2010

CLINICAL DATA: Cough and body aches.

CHEST - 2 VIEW

[w chest pa]
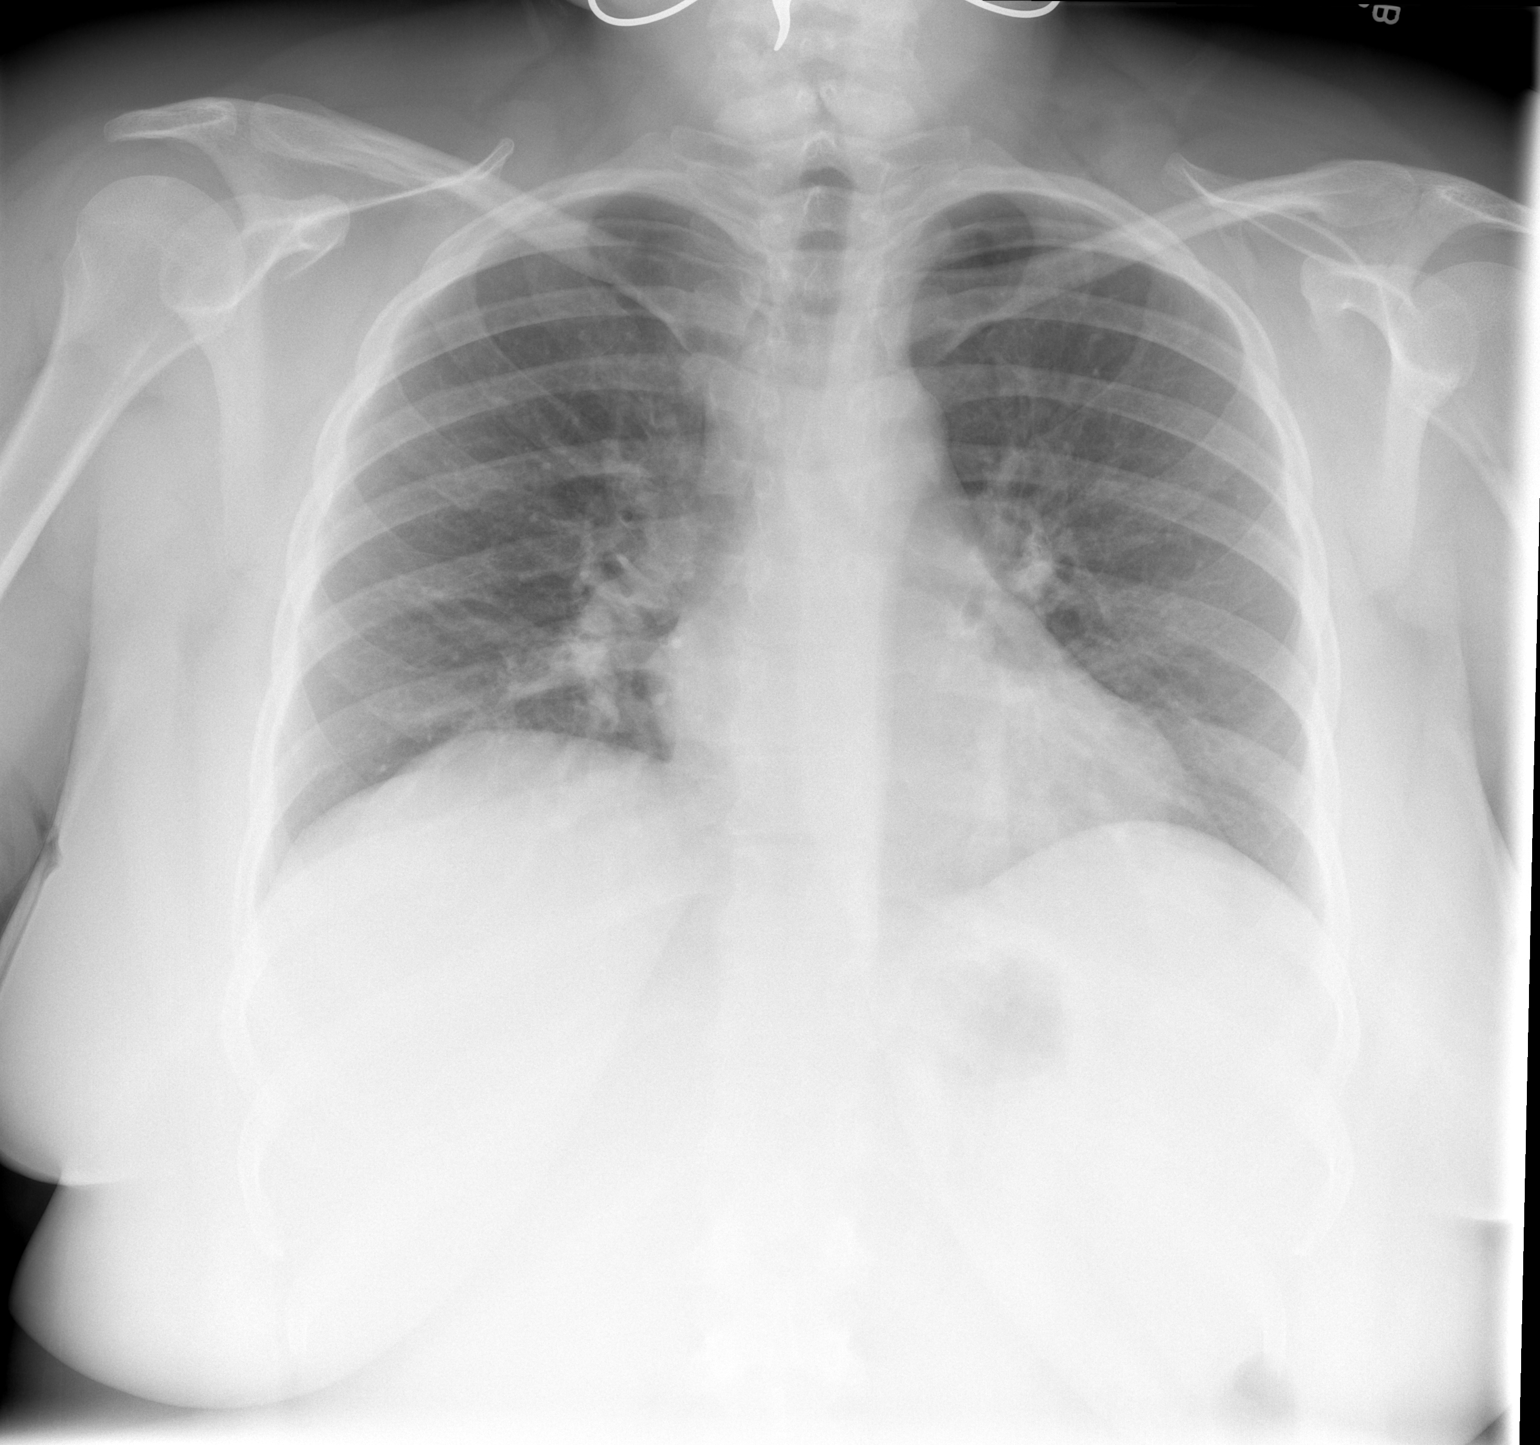

[w chest lat]
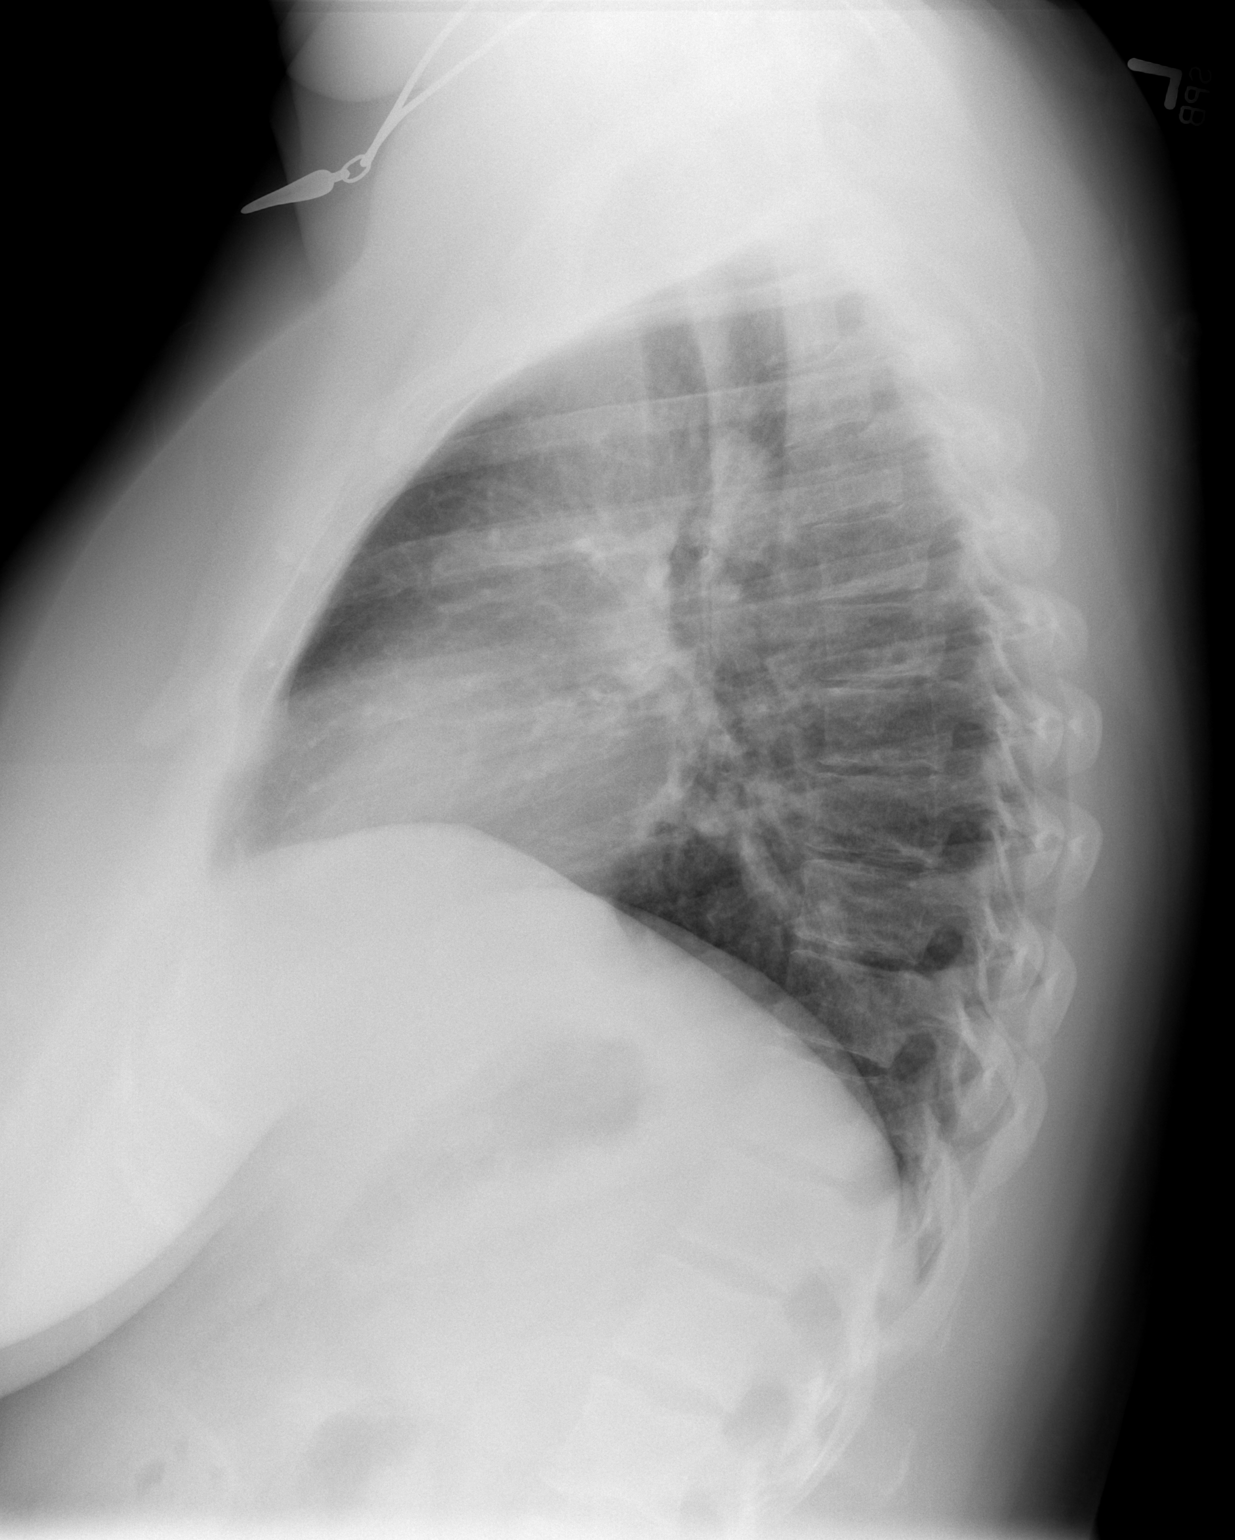

[2 of 2 positions shown; findings below may reference images not displayed]

FINDINGS: Low volume film. The lungs are clear without focal
infiltrate, edema, pneumothorax or pleural effusion. The
cardiopericardial silhouette is within normal limits for size.
Imaged bony structures of the thorax are intact.
IMPRESSION: Low volume film without acute findings.

## 2015-03-25 ENCOUNTER — Emergency Department (HOSPITAL_BASED_OUTPATIENT_CLINIC_OR_DEPARTMENT_OTHER)
Admission: EM | Admit: 2015-03-25 | Discharge: 2015-03-25 | Discharge status: Against Medical Advice | Payer: Medicaid Other | Attending: Emergency Medicine | Admitting: Emergency Medicine

## 2015-03-25 ENCOUNTER — Encounter (HOSPITAL_BASED_OUTPATIENT_CLINIC_OR_DEPARTMENT_OTHER): Payer: Self-pay | Admitting: Emergency Medicine

## 2015-03-25 DIAGNOSIS — F17200 Nicotine dependence, unspecified, uncomplicated: Secondary | ICD-10-CM | POA: Insufficient documentation

## 2015-03-25 DIAGNOSIS — R21 Rash and other nonspecific skin eruption: Secondary | ICD-10-CM | POA: Insufficient documentation

## 2015-03-25 HISTORY — DX: Insomnia, unspecified: G47.00

## 2015-03-25 HISTORY — DX: Major depressive disorder, single episode, unspecified: F32.9

## 2015-03-25 HISTORY — DX: Depression, unspecified: F32.A

## 2015-03-25 NOTE — ED Notes (Signed)
Rash for two weeks.  Small red areas on hands and base of neck.

## 2018-09-23 ENCOUNTER — Other Ambulatory Visit: Payer: Self-pay | Admitting: Student
